# Patient Record
Sex: Male | Born: 1975 | Race: White | Hispanic: Yes | Marital: Single | Smoking: Never smoker
Health system: Southern US, Community
[De-identification: ages and names within clinical notes are randomized; demographics above are authoritative.]

## PROBLEM LIST (undated history)

## (undated) DIAGNOSIS — R011 Cardiac murmur, unspecified: Secondary | ICD-10-CM

## (undated) DIAGNOSIS — E119 Type 2 diabetes mellitus without complications: Secondary | ICD-10-CM

## (undated) DIAGNOSIS — J45909 Unspecified asthma, uncomplicated: Secondary | ICD-10-CM

## (undated) HISTORY — DX: Type 2 diabetes mellitus without complications: E11.9

---

## 2004-02-02 ENCOUNTER — Emergency Department: Payer: Self-pay | Admitting: Emergency Medicine

## 2006-10-24 ENCOUNTER — Emergency Department: Payer: Self-pay | Admitting: Emergency Medicine

## 2007-06-28 ENCOUNTER — Emergency Department: Payer: Self-pay | Admitting: Emergency Medicine

## 2007-10-02 ENCOUNTER — Other Ambulatory Visit: Payer: Self-pay

## 2007-10-02 ENCOUNTER — Emergency Department: Payer: Self-pay | Admitting: Emergency Medicine

## 2009-02-22 IMAGING — CT CT ABD-PELV W/O CM
1 of 2 series · 15 of 32 positions shown, 19 images · non-contrast
Comparison: none

REASON FOR EXAM: (1) right hydronephrosis; (2) renal  calculi
COMMENTS:

[Series 2: stone · axial · 0.86mm/px · z∈[-630,-183]mm · 15 of 163 slices shown, 19 images]
[im 7/163  soft-tissue]
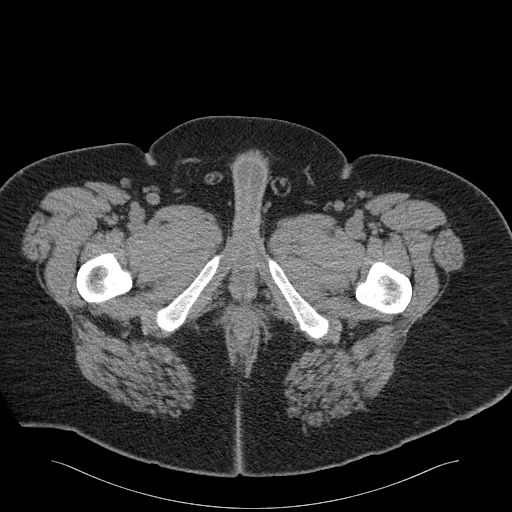
[im 7/163  bone]
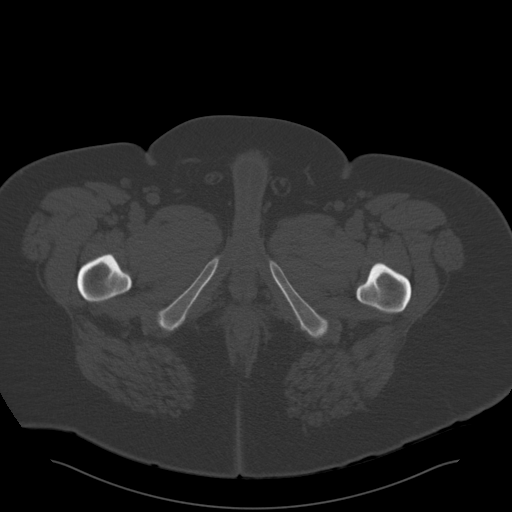
[im 21/163  soft-tissue]
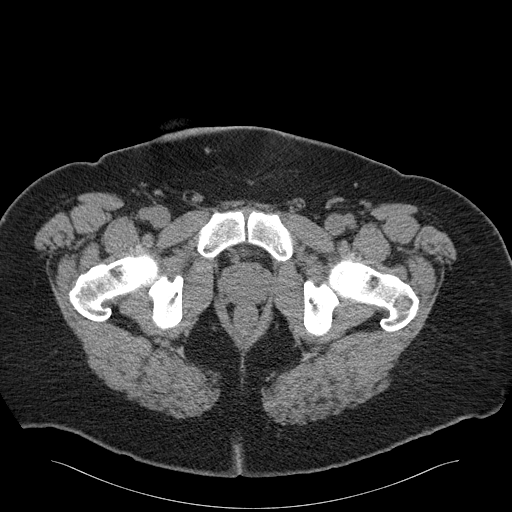
[im 34/163  soft-tissue]
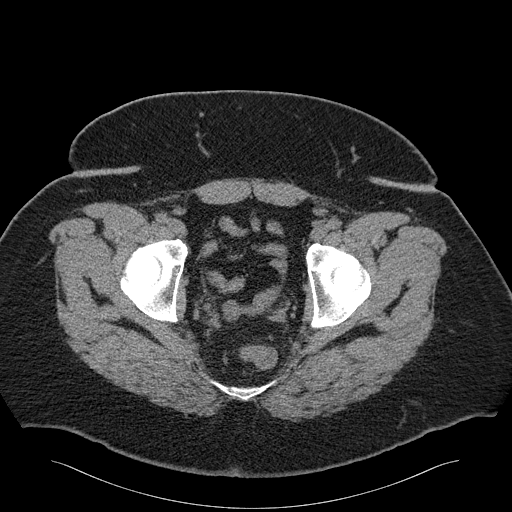
[im 48/163  soft-tissue]
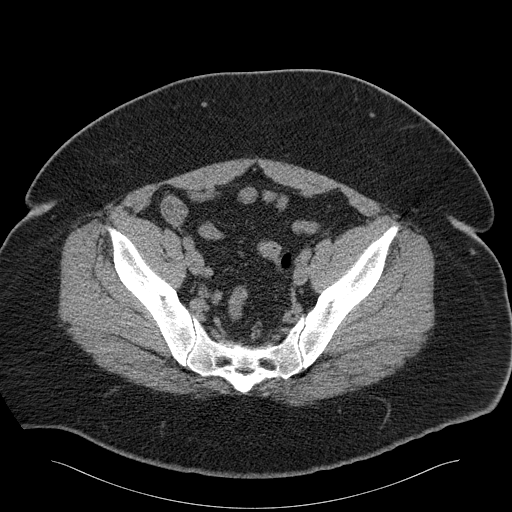
[im 55/163  soft-tissue]
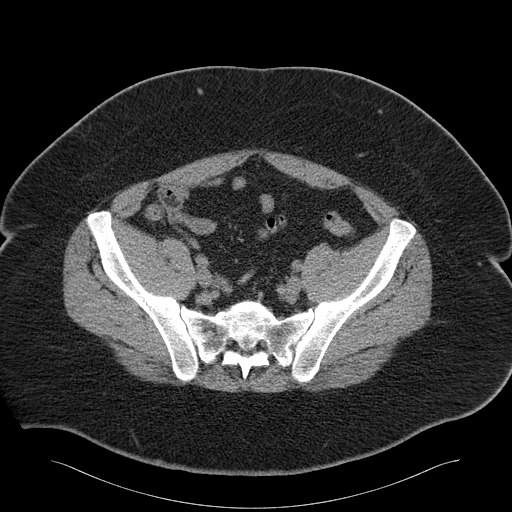
[im 68/163  soft-tissue]
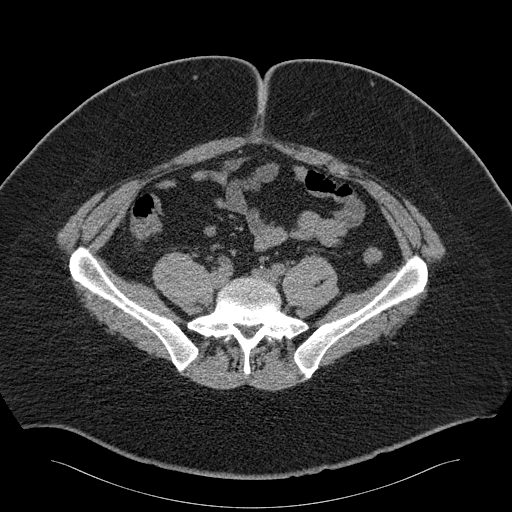
[im 82/163  soft-tissue]
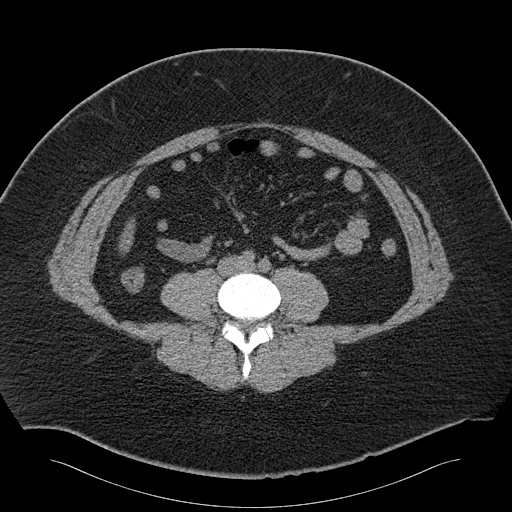
[im 95/163  soft-tissue]
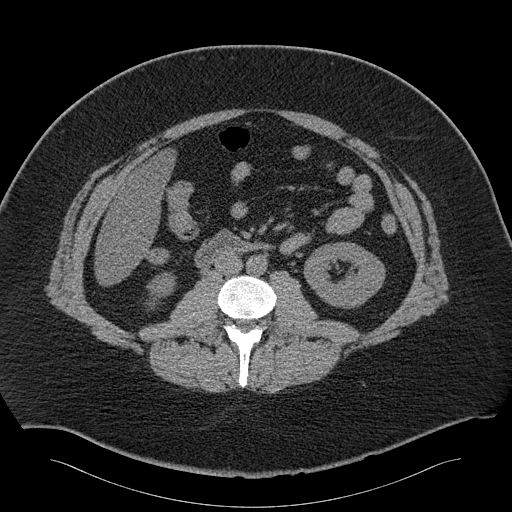
[im 109/163  soft-tissue]
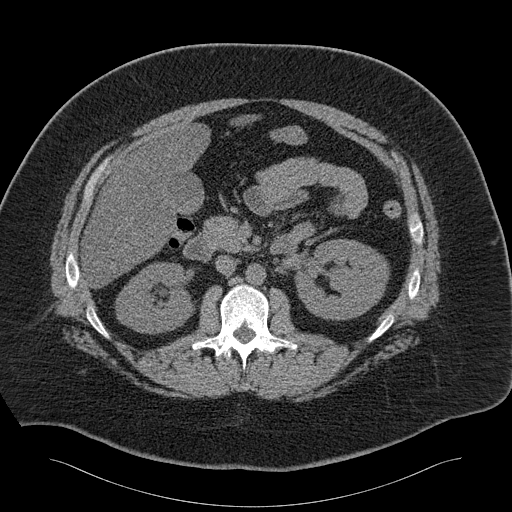
[im 109/163  bone]
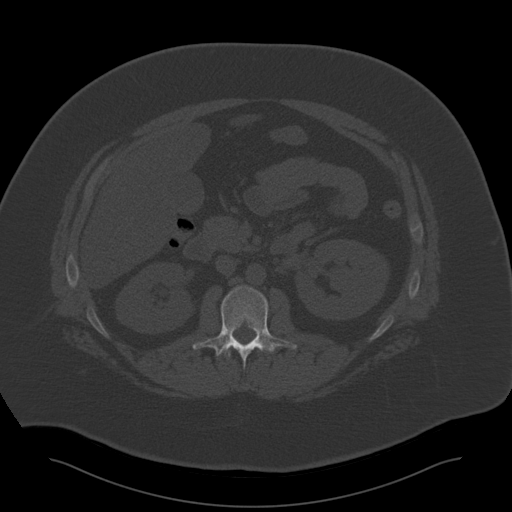
[im 115/163  soft-tissue]
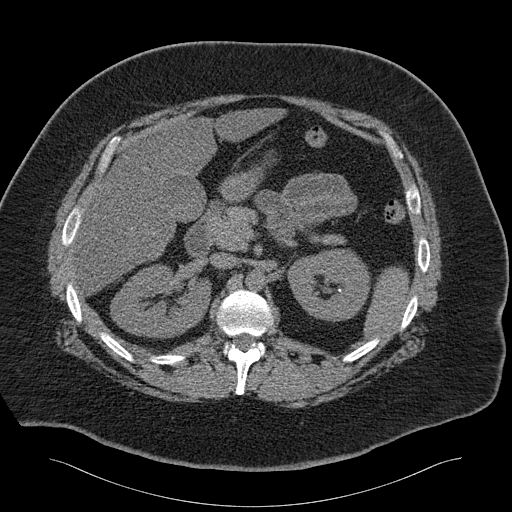
[im 129/163  soft-tissue]
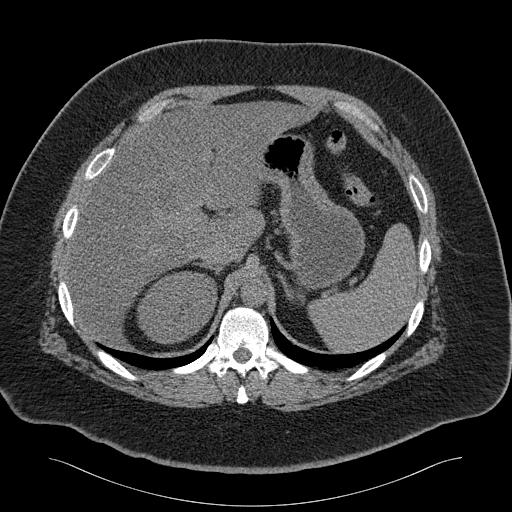
[im 136/163  lung]
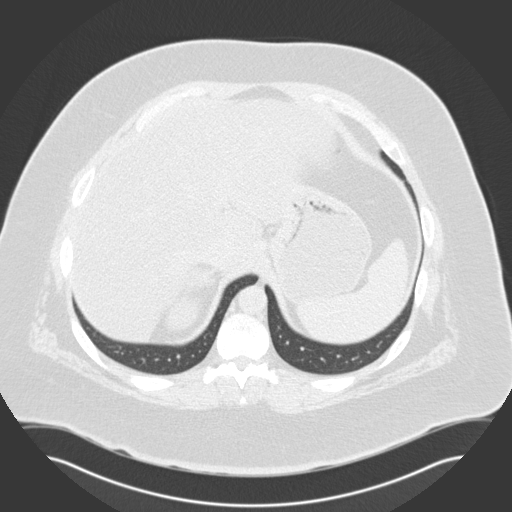
[im 142/163  soft-tissue]
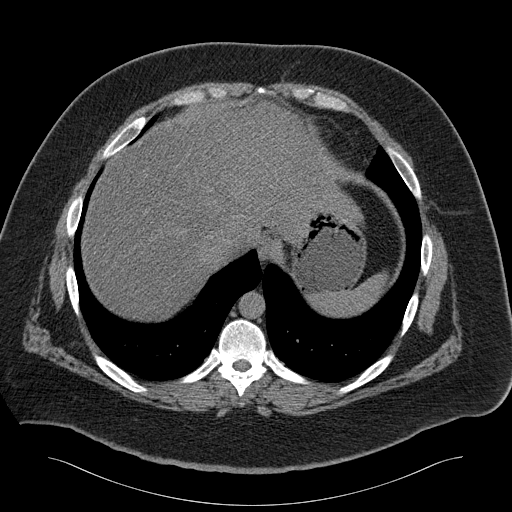
[im 142/163  lung]
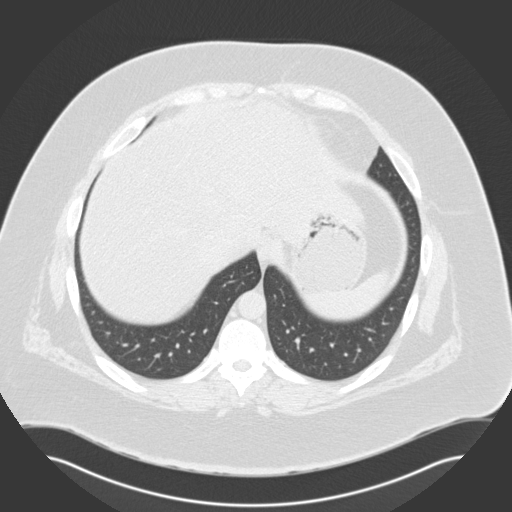
[im 149/163  lung]
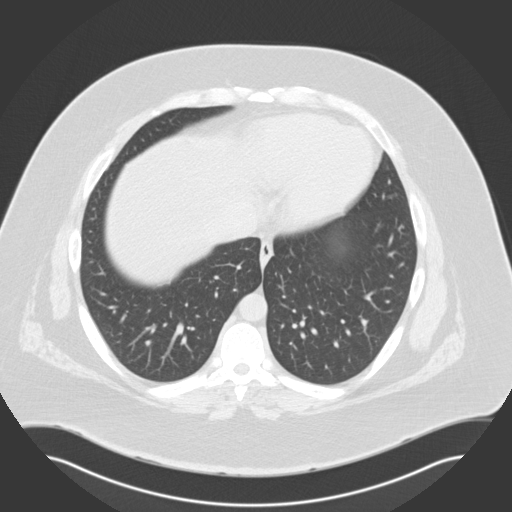
[im 156/163  soft-tissue]
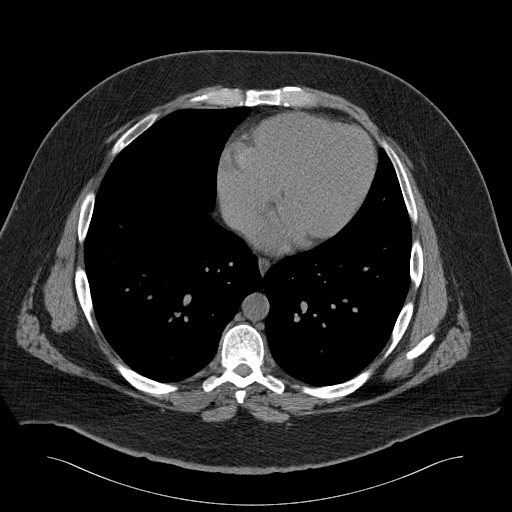
[im 156/163  lung]
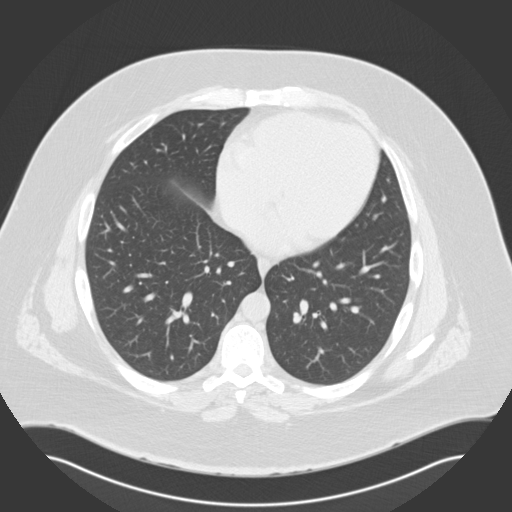

[15 of 32 positions shown; findings below may reference images not displayed]

PROCEDURE:     CT  - CT ABDOMEN AND PELVIS W[DATE]  [DATE]

RESULT:     The study was performed without IV contrast to assess the
patient for possible urinary tract stones. On the right there is very mild
hydronephrosis. The etiology for this is not clear. Very mild dilation of
the proximal right ureter is seen and on image 65 a punctate calcification
is seen but this is not clearly the Colles of the patient's ureteral
obstruction. A definite stone along the expected course of the right ureter
is not seen. Certainly the possibility of a noncalcified stone is raised.
There is a nonobstructing 3 mm diameter midpole calyceal stone on the left.
The urinary bladder is normal in appearance. The liver, stomach, spleen,
adrenal glands, pancreas, and abdominal aorta are normal in appearance. No
calcified gallstones are identified. The unopacified loops of small and
large bowel are normal in appearance as well. Appendix is demonstrated and
contains gas. There is no evidence of bowel obstruction. No free fluid is
seen in the abdomen or pelvis. The lung bases are clear.
IMPRESSION: 1. There is very mild hydronephrosis on the right without obvious etiology.
The right ureter is minimally distended. The patient may have recently
passed a stone or there could be a radiolucent stone present. There is a
nonobstructing 3 millimeter diameter calyceal stone on the left. Further
evaluation with contrast-enhanced study could be considered if the patient's
clinical history does not suggest that the stone has passed.
2. I see no acute abnormality elsewhere in the abdomen or pelvis.

The findings were called to the emergency department occlusion of the study.

## 2009-10-28 IMAGING — CR LEFT WRIST - COMPLETE 3+ VIEW
1 series · 4 of 4 positions shown · non-contrast
Comparison: none

REASON FOR EXAM: pain
COMMENTS:   LMP: (Male)

PROCEDURE:     DXR - DXR WRIST LT COMP WITH OBLIQUES  - June 29, 2007 [DATE]
RESULT:     Images of the left wrist demonstrate no fracture, dislocation or
radiopaque foreign body.

[Series 1: view not recorded · 0.17mm/px · 4 of 4 slices shown]
[im 1/4]
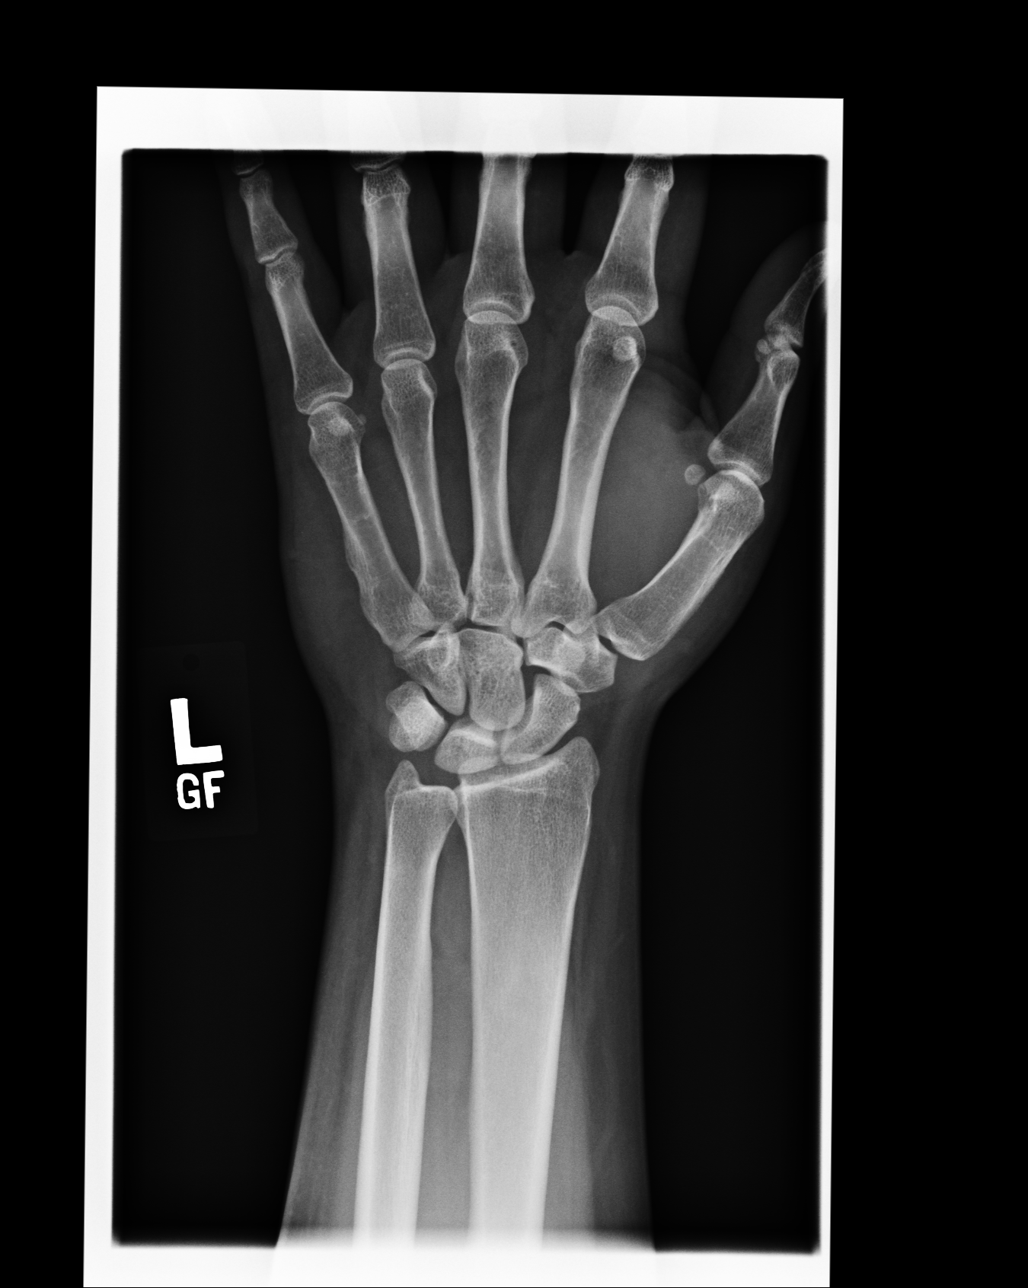
[im 2/4]
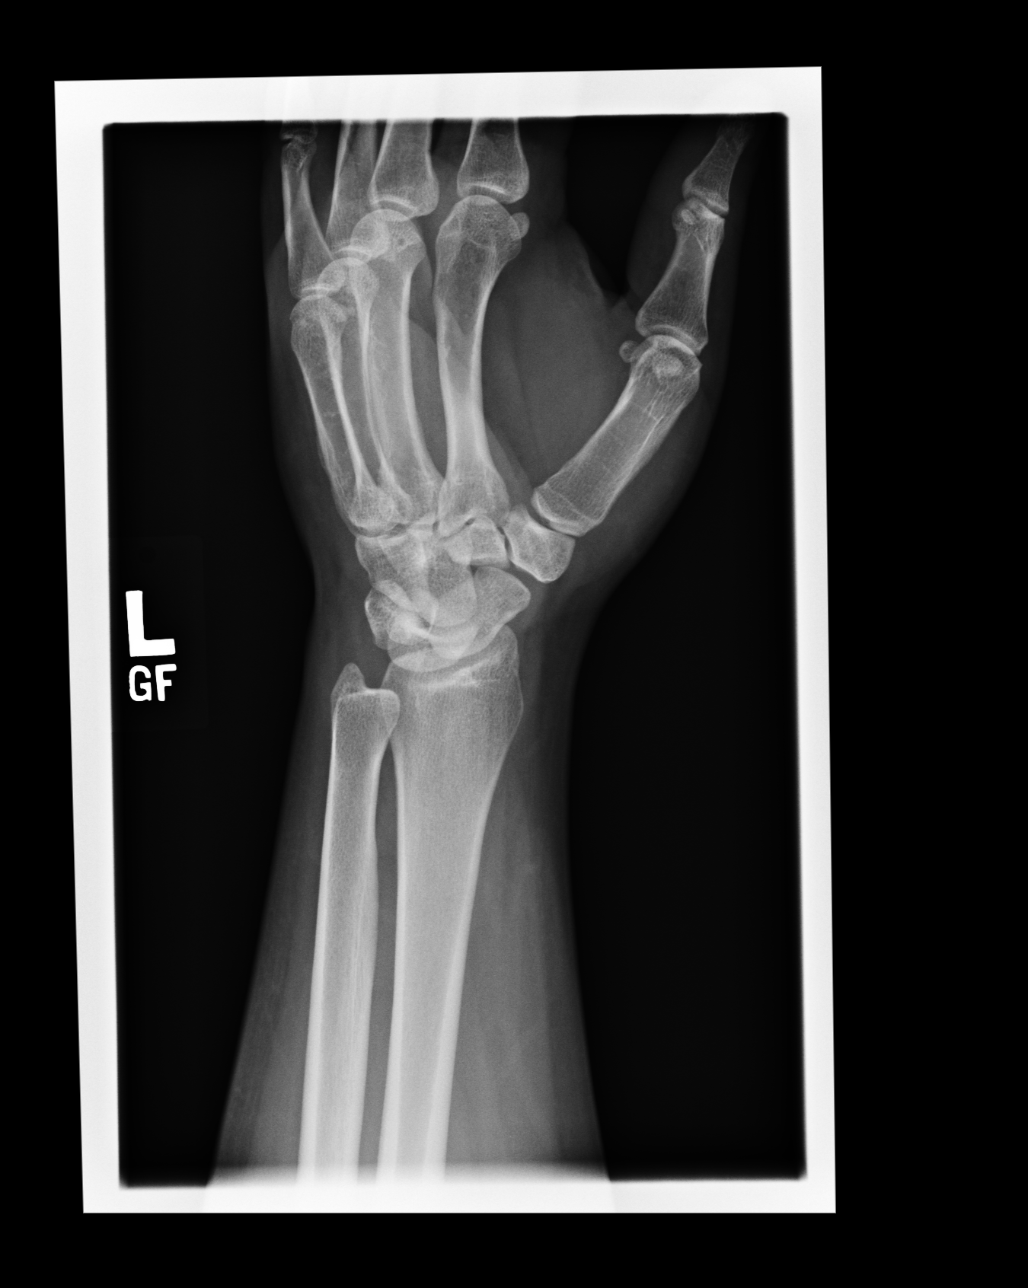
[im 3/4]
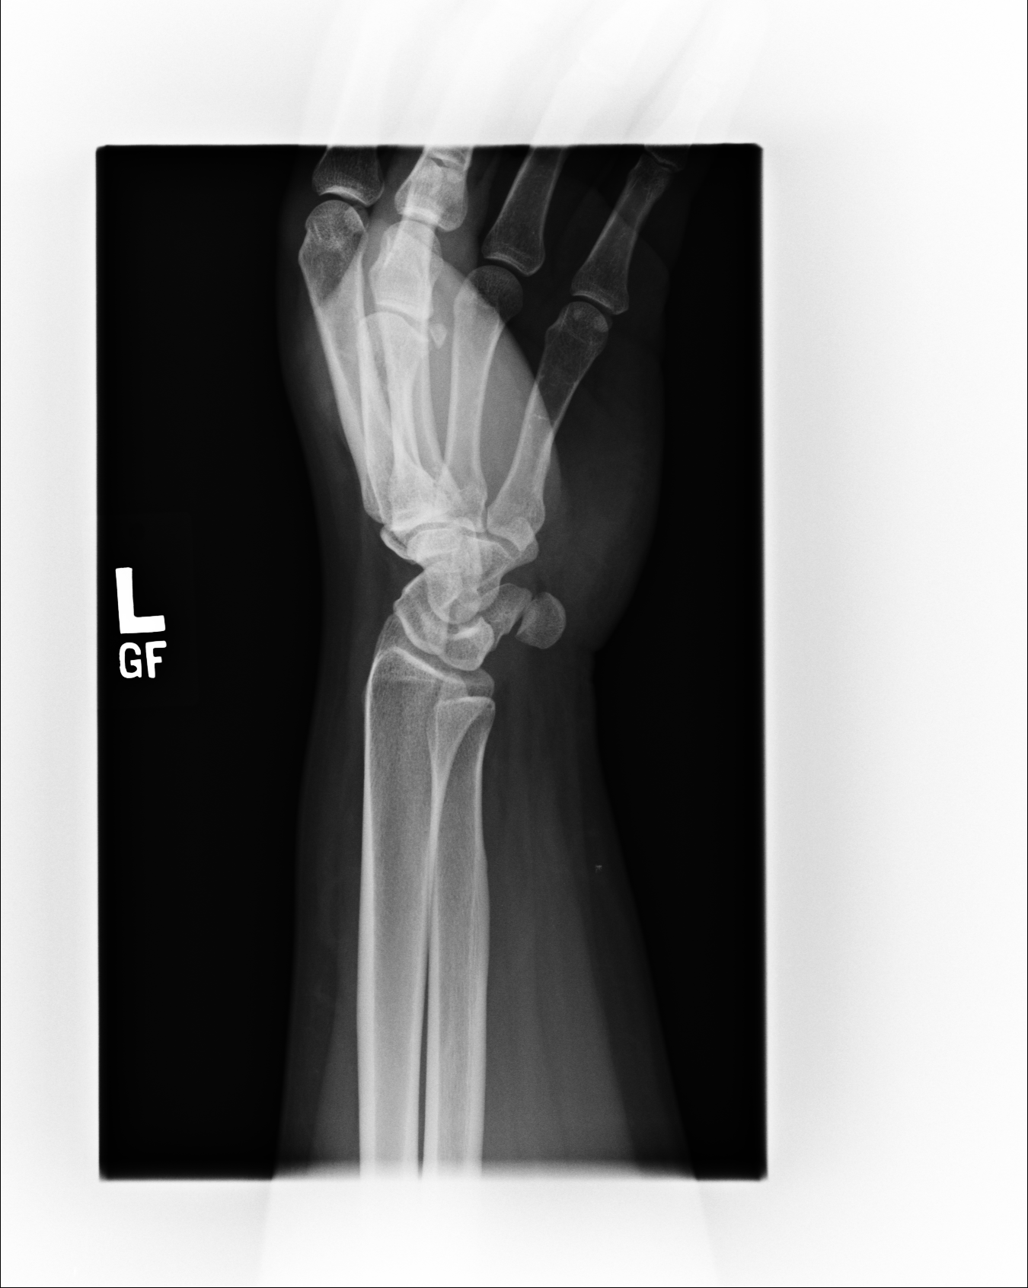
[im 4/4]
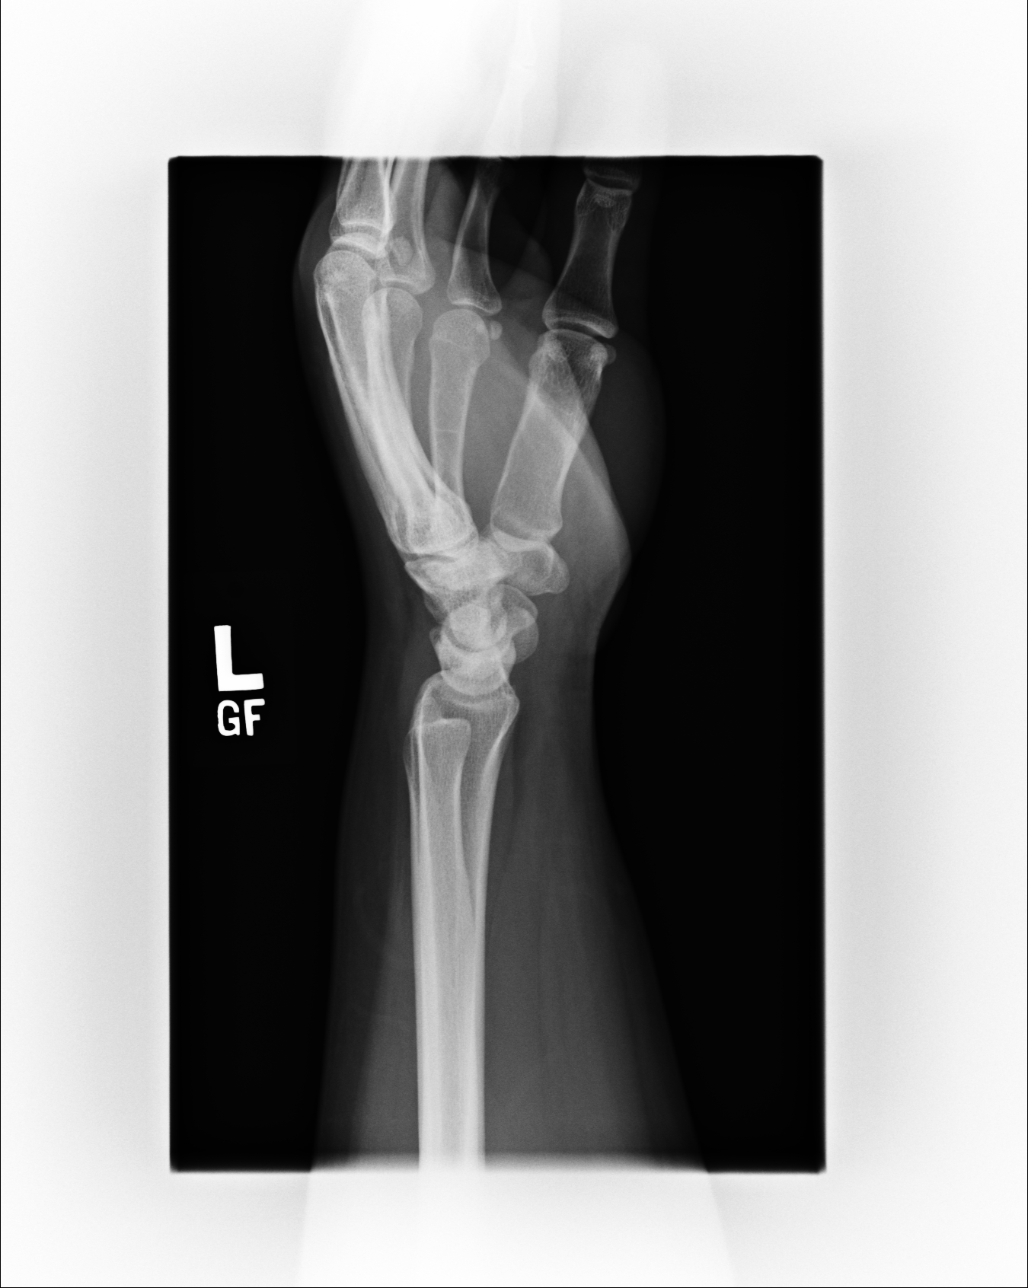

[4 of 4 positions shown; findings below may reference images not displayed]

IMPRESSION: Please see above.

## 2016-09-19 ENCOUNTER — Emergency Department
Admission: EM | Admit: 2016-09-19 | Discharge: 2016-09-19 | Disposition: A | Discharge status: Home / Self Care | Payer: PRIVATE HEALTH INSURANCE | Attending: Emergency Medicine | Admitting: Emergency Medicine

## 2016-09-19 ENCOUNTER — Emergency Department: Payer: PRIVATE HEALTH INSURANCE

## 2016-09-19 DIAGNOSIS — R319 Hematuria, unspecified: Secondary | ICD-10-CM | POA: Insufficient documentation

## 2016-09-19 DIAGNOSIS — N201 Calculus of ureter: Secondary | ICD-10-CM | POA: Insufficient documentation

## 2016-09-19 DIAGNOSIS — N23 Unspecified renal colic: Secondary | ICD-10-CM | POA: Diagnosis not present

## 2016-09-19 DIAGNOSIS — J45909 Unspecified asthma, uncomplicated: Secondary | ICD-10-CM | POA: Diagnosis not present

## 2016-09-19 DIAGNOSIS — R109 Unspecified abdominal pain: Secondary | ICD-10-CM | POA: Diagnosis present

## 2016-09-19 HISTORY — DX: Unspecified asthma, uncomplicated: J45.909

## 2016-09-19 HISTORY — DX: Cardiac murmur, unspecified: R01.1

## 2016-09-19 LAB — BASIC METABOLIC PANEL
Anion gap: 8 (ref 5–15)
BUN: 15 mg/dL (ref 6–20)
CALCIUM: 9.1 mg/dL (ref 8.9–10.3)
CO2: 25 mmol/L (ref 22–32)
CREATININE: 0.81 mg/dL (ref 0.61–1.24)
Chloride: 106 mmol/L (ref 101–111)
GFR calc Af Amer: >60 mL/min (ref >60)
GLUCOSE: 216 mg/dL — AB (ref 65–99)
Potassium: 3.8 mmol/L (ref 3.5–5.1)
Sodium: 139 mmol/L (ref 135–145)

## 2016-09-19 LAB — URINALYSIS, COMPLETE (UACMP) WITH MICROSCOPIC
BILIRUBIN URINE: NEGATIVE
Bacteria, UA: NONE SEEN
Glucose, UA: NEGATIVE mg/dL
Leukocytes, UA: NEGATIVE
NITRITE: NEGATIVE
SQUAMOUS EPITHELIAL / LPF: NONE SEEN
Specific Gravity, Urine: 1.02 (ref 1.005–1.030)
pH: 7 (ref 5.0–8.0)

## 2016-09-19 LAB — CBC
HCT: 41.6 % (ref 40.0–52.0)
Hemoglobin: 14.4 g/dL (ref 13.0–18.0)
MCH: 29.8 pg (ref 26.0–34.0)
MCHC: 34.6 g/dL (ref 32.0–36.0)
MCV: 86 fL (ref 80.0–100.0)
PLATELETS: 225 10*3/uL (ref 150–440)
RBC: 4.84 MIL/uL (ref 4.40–5.90)
RDW: 13.4 % (ref 11.5–14.5)
WBC: 12.6 10*3/uL — ABNORMAL HIGH (ref 3.8–10.6)

## 2016-09-19 MED ORDER — MORPHINE SULFATE (PF) 4 MG/ML IV SOLN
4.0000 mg | Freq: Once | INTRAVENOUS | Status: AC
  Administered 2016-09-19: 4 mg via INTRAVENOUS
  Filled 2016-09-19: qty 1

## 2016-09-19 MED ORDER — ONDANSETRON HCL 4 MG/2ML IJ SOLN
4.0000 mg | Freq: Once | INTRAMUSCULAR | Status: AC
  Administered 2016-09-19: 4 mg via INTRAVENOUS
  Filled 2016-09-19: qty 2

## 2016-09-19 MED ORDER — SODIUM CHLORIDE 0.9 % IV BOLUS (SEPSIS)
1000.0000 mL | Freq: Once | INTRAVENOUS | Status: AC
  Administered 2016-09-19: 1000 mL via INTRAVENOUS

## 2016-09-19 MED ORDER — KETOROLAC TROMETHAMINE 10 MG PO TABS: 10.0000 mg | ORAL_TABLET | Freq: Four times a day (QID) | ORAL | 0 refills | Status: DC | PRN

## 2016-09-19 MED ORDER — KETOROLAC TROMETHAMINE 30 MG/ML IJ SOLN
15.0000 mg | INTRAMUSCULAR | Status: AC
  Administered 2016-09-19: 15 mg via INTRAVENOUS
  Filled 2016-09-19: qty 1

## 2016-09-19 NOTE — ED Triage Notes (Signed)
Pt states that he believes that he has a kidney stone.  Pt states that he has a history of kidney stones.  Pt wiggling and unable to stay still in triage.  Pt states that he took hydrocodone prior to arrival.  Pt states that he had no relief.  Pt denies taking flomax.  Pt states his last kidney stone was 3-4 years ago.

## 2016-09-19 NOTE — ED Provider Notes (Signed)
[facility] Emergency Department Provider Note  ____________________________________________  Time seen: Approximately 5:01 AM  I have reviewed the triage vital signs and the nursing notes.   HISTORY  Chief Complaint Flank Pain    HPI Benjamin Fisher is a 41 y.o. male who complains of right flank pain that started about 9 PM tonight. She tried taking hydrocodone which seemed to help a little bit but inadequately. Has a history of kidney stones and this feels similar. No dysuria frequency urgency. No fevers chills. No abdominal pain chest pain shortness of breath or syncope. Pain is been constant, gradual onset, waxing and waning. No aggravating or alleviating factors. Radiates to the penis.     Past Medical History:  Diagnosis Date  . Asthma   . Heart murmur      There are no active problems to display for this patient.    History reviewed. No pertinent surgical history.   Prior to Admission medications   Medication Sig Start Date End Date Taking? Authorizing Provider  ketorolac (TORADOL) 10 MG tablet Take 1 tablet (10 mg total) by mouth every 6 (six) hours as needed for moderate pain. 09/19/16   Sharman Cheek, MD     Allergies Aspirin   No family history on file.  Social History Social History  Substance Use Topics  . Smoking status: Never Smoker  . Smokeless tobacco: Never Used  . Alcohol use Yes     Comment: occasionally    Review of Systems  Constitutional:   No fever or chills.  ENT:   No sore throat. No rhinorrhea. Cardiovascular:   No chest pain or syncope. Respiratory:   No dyspnea or cough. Gastrointestinal:  Right flank pain as above. No vomiting or diarrhea. No constipation..  Musculoskeletal:   Negative for focal pain or swelling All other systems reviewed and are negative except as documented above in ROS and HPI.  ____________________________________________   PHYSICAL EXAM:  VITAL SIGNS: ED Triage Vitals   Enc Vitals Group     BP 09/19/16 0308 (!) 197/106     Pulse Rate 09/19/16 0308 94     Resp 09/19/16 0308 (!) 24     Temp 09/19/16 0308 98 F (36.7 C)     Temp Source 09/19/16 0308 Oral     SpO2 09/19/16 0308 98 %     Weight 09/19/16 0306 (!) 380 lb (172.4 kg)     Height 09/19/16 0306 6' (1.829 m)     Head Circumference --      Peak Flow --      Pain Score 09/19/16 0308 10     Pain Loc --      Pain Edu? --      Excl. in GC? --     Vital signs reviewed, nursing assessments reviewed.   Constitutional:   Alert and oriented. Well appearing and in no distress. Eyes:   No scleral icterus.  EOMI. No nystagmus. No conjunctival pallor. PERRL. ENT   Head:   Normocephalic and atraumatic.   Nose:   No congestion/rhinnorhea.    Mouth/Throat:   MMM, no pharyngeal erythema. No peritonsillar mass.    Neck:   No meningismus. Full ROM Hematological/Lymphatic/Immunilogical:   No cervical lymphadenopathy. Cardiovascular:   RRR. Symmetric bilateral radial and DP pulses.  No murmurs.  Respiratory:   Normal respiratory effort without tachypnea/retractions. Breath sounds are clear and equal bilaterally. No wheezes/rales/rhonchi. Gastrointestinal:   Soft and nontender. Non distended. There is no CVA tenderness.  No  rebound, rigidity, or guarding. Genitourinary:   deferred Musculoskeletal:   Normal range of motion in all extremities. No joint effusions.  No lower extremity tenderness.  No edema. Neurologic:   Normal speech and language.  Motor grossly intact. No gross focal neurologic deficits are appreciated.  Skin:    Skin is warm, dry and intact. No rash noted.  No petechiae, purpura, or bullae.  ____________________________________________    LABS (pertinent positives/negatives) (all labs ordered are listed, but only abnormal results are displayed) Labs Reviewed  URINALYSIS, COMPLETE (UACMP) WITH MICROSCOPIC - Abnormal; Notable for the following:       Result Value   Color,  Urine STRAW (*)    APPearance HAZY (*)    Hgb urine dipstick MODERATE (*)    Ketones, ur TRACE (*)    Protein, ur TRACE (*)    All other components within normal limits  BASIC METABOLIC PANEL - Abnormal; Notable for the following:    Glucose, Bld 216 (*)    All other components within normal limits  CBC - Abnormal; Notable for the following:    WBC 12.6 (*)    All other components within normal limits   ____________________________________________   EKG    ____________________________________________    RADIOLOGY  Ct Renal Stone Study  Result Date: 09/19/2016 CLINICAL DATA:  Right flank pain. EXAM: CT ABDOMEN AND PELVIS WITHOUT CONTRAST TECHNIQUE: Multidetector CT imaging of the abdomen and pelvis was performed following the standard protocol without IV contrast. COMPARISON:  CT 10/24/2006 FINDINGS: Lower chest: The lung bases are clear.  No pleural fluid. Hepatobiliary: The liver is enlarged spanning 25 cm cranial caudal with severe steatosis. No evidence of focal lesion. Gallbladder is decompressed and not well evaluated. No calcified stone. No biliary dilatation. Pancreas: No ductal dilatation or inflammation. Spleen: Prominent size spanning 14.2 cm AP. Adrenals/Urinary Tract: Punctate obstructing stone at the right ureterovesicular junction with mild proximal hydroureteronephrosis and perinephric edema. There is an additional punctate nonobstructing stone in the interpolar right kidney. Multiple, at least 5, nonobstructing stones in the left kidney with no left hydronephrosis. Left ureter is decompressed. Urinary bladder is nondistended. Normal adrenal glands. Stomach/Bowel: Ingested material in the stomach. No evidence of bowel wall thickening, inflammation or obstruction. Normal appendix. Vascular/Lymphatic: No significant vascular findings are present. No enlarged abdominal or pelvic lymph nodes. Reproductive: Prostate is unremarkable. Other: No free air, free fluid, or  intra-abdominal fluid collection. Minimal fat in the right inguinal canal. Musculoskeletal: There are no acute or suspicious osseous abnormalities. Transitional lumbosacral anatomy is incidentally noted. IMPRESSION: 1. Punctate obstructing stone at the right ureterovesicular junction with mild hydronephrosis. 2. Nonobstructing bilateral renal calculi. 3. Hepatic steatosis and hepatomegaly.  Mild splenomegaly. Electronically Signed   By: Rubye OaksMelanie  Ehinger M.D.   On: 09/19/2016 03:56    ____________________________________________   PROCEDURES Procedures  ____________________________________________   INITIAL IMPRESSION / ASSESSMENT AND PLAN / ED COURSE  Pertinent labs & imaging results that were available during my care of the patient were reviewed by me and considered in my medical decision making (see chart for details).  Patient well appearing no acute distress, vital signs unremarkable. Pain resolved after receiving IV Toradol. Workup diagnostic for a small right UVJ stone which can be expected to spontaneously pass any minute. Despite receiving complete pain relief from Toradol, patient reports he's had Toradol before and it didn't work for him in the past and requests Percocet by name. Patient was advised that if he does want to take Toradol, naproxen  as another option. Follow-up with primary care. Considering the patient's symptoms, medical history, and physical examination today, I have low suspicion for cholecystitis or biliary pathology, pancreatitis, perforation or bowel obstruction, hernia, intra-abdominal abscess, AAA or dissection, volvulus or intussusception, mesenteric ischemia, or appendicitis.  No evidence of high-grade ureteral obstruction, urinary tract infection pyelonephritis or sepsis.     ____________________________________________   FINAL CLINICAL IMPRESSION(S) / ED DIAGNOSES  Final diagnoses:  Ureteral colic  Hematuria, unspecified type      New  Prescriptions   KETOROLAC (TORADOL) 10 MG TABLET    Take 1 tablet (10 mg total) by mouth every 6 (six) hours as needed for moderate pain.     Portions of this note were generated with dragon dictation software. Dictation errors may occur despite best attempts at proofreading.    Sharman Cheek, MD 09/19/16 [phone]

## 2016-09-19 NOTE — Discharge Instructions (Signed)
Results for orders placed or performed during the hospital encounter of 09/19/16  Urinalysis, Complete w Microscopic  Result Value Ref Range   Color, Urine STRAW (A) YELLOW   APPearance HAZY (A) CLEAR   Specific Gravity, Urine 1.020 1.005 - 1.030   pH 7.0 5.0 - 8.0   Glucose, UA NEGATIVE NEGATIVE mg/dL   Hgb urine dipstick MODERATE (A) NEGATIVE   Bilirubin Urine NEGATIVE NEGATIVE   Ketones, ur TRACE (A) NEGATIVE mg/dL   Protein, ur TRACE (A) NEGATIVE mg/dL   Nitrite NEGATIVE NEGATIVE   Leukocytes, UA NEGATIVE NEGATIVE   Squamous Epithelial / LPF NONE SEEN NONE SEEN   WBC, UA 0-5 0 - 5 WBC/hpf   RBC / HPF TOO NUMEROUS TO COUNT 0 - 5 RBC/hpf   Bacteria, UA NONE SEEN NONE SEEN   Mucous PRESENT   Basic metabolic panel  Result Value Ref Range   Sodium 139 135 - 145 mmol/L   Potassium 3.8 3.5 - 5.1 mmol/L   Chloride 106 101 - 111 mmol/L   CO2 25 22 - 32 mmol/L   Glucose, Bld 216 (H) 65 - 99 mg/dL   BUN 15 6 - 20 mg/dL   Creatinine, Ser 1.610.81 0.61 - 1.24 mg/dL   Calcium 9.1 8.9 - 09.610.3 mg/dL   GFR calc non Af Amer >60 >60 mL/min   GFR calc Af Amer >60 >60 mL/min   Anion gap 8 5 - 15  CBC  Result Value Ref Range   WBC 12.6 (H) 3.8 - 10.6 K/uL   RBC 4.84 4.40 - 5.90 MIL/uL   Hemoglobin 14.4 13.0 - 18.0 g/dL   HCT 04.541.6 40.940.0 - 81.152.0 %   MCV 86.0 80.0 - 100.0 fL   MCH 29.8 26.0 - 34.0 pg   MCHC 34.6 32.0 - 36.0 g/dL   RDW 91.413.4 78.211.5 - 95.614.5 %   Platelets 225 150 - 440 K/uL   Ct Renal Stone Study  Result Date: 09/19/2016 CLINICAL DATA:  Right flank pain. EXAM: CT ABDOMEN AND PELVIS WITHOUT CONTRAST TECHNIQUE: Multidetector CT imaging of the abdomen and pelvis was performed following the standard protocol without IV contrast. COMPARISON:  CT 10/24/2006 FINDINGS: Lower chest: The lung bases are clear.  No pleural fluid. Hepatobiliary: The liver is enlarged spanning 25 cm cranial caudal with severe steatosis. No evidence of focal lesion. Gallbladder is decompressed and not well evaluated. No  calcified stone. No biliary dilatation. Pancreas: No ductal dilatation or inflammation. Spleen: Prominent size spanning 14.2 cm AP. Adrenals/Urinary Tract: Punctate obstructing stone at the right ureterovesicular junction with mild proximal hydroureteronephrosis and perinephric edema. There is an additional punctate nonobstructing stone in the interpolar right kidney. Multiple, at least 5, nonobstructing stones in the left kidney with no left hydronephrosis. Left ureter is decompressed. Urinary bladder is nondistended. Normal adrenal glands. Stomach/Bowel: Ingested material in the stomach. No evidence of bowel wall thickening, inflammation or obstruction. Normal appendix. Vascular/Lymphatic: No significant vascular findings are present. No enlarged abdominal or pelvic lymph nodes. Reproductive: Prostate is unremarkable. Other: No free air, free fluid, or intra-abdominal fluid collection. Minimal fat in the right inguinal canal. Musculoskeletal: There are no acute or suspicious osseous abnormalities. Transitional lumbosacral anatomy is incidentally noted. IMPRESSION: 1. Punctate obstructing stone at the right ureterovesicular junction with mild hydronephrosis. 2. Nonobstructing bilateral renal calculi. 3. Hepatic steatosis and hepatomegaly.  Mild splenomegaly. Electronically Signed   By: Rubye OaksMelanie  Ehinger M.D.   On: 09/19/2016 03:56

## 2019-01-19 IMAGING — CT CT RENAL STONE PROTOCOL
2 of 4 series · 16 of 46 positions shown, 18 images · non-contrast
Comparison: CT 10/24/2006

CLINICAL DATA: Right flank pain.

EXAM:
CT ABDOMEN AND PELVIS WITHOUT CONTRAST
TECHNIQUE: Multidetector CT imaging of the abdomen and pelvis was performed
following the standard protocol without IV contrast.

[Series 2: stone full standard · axial · 0.98mm/px · z∈[-1048,-568]mm · 13 of 106 slices shown, 15 images]
[im 5/106  soft-tissue]
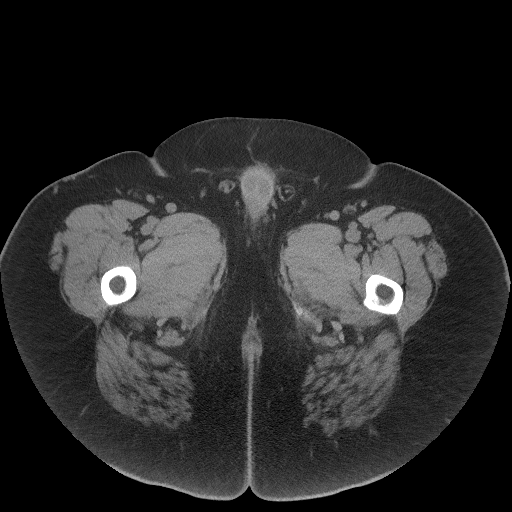
[im 5/106  bone]
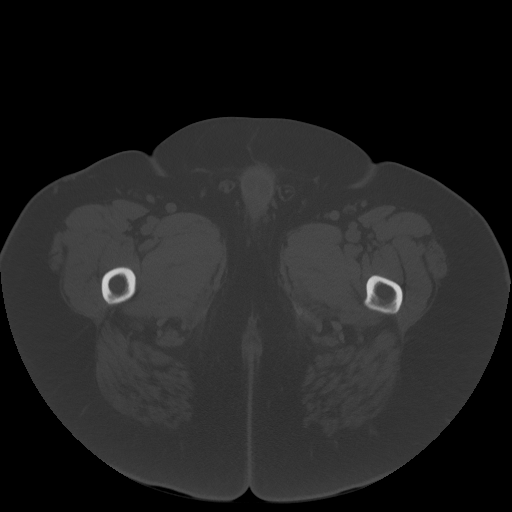
[im 14/106  soft-tissue]
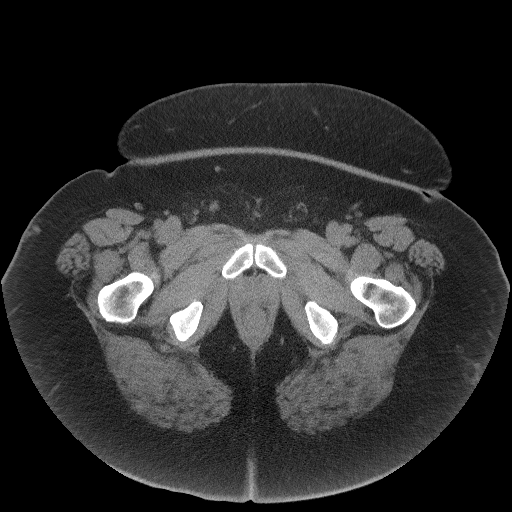
[im 22/106  soft-tissue]
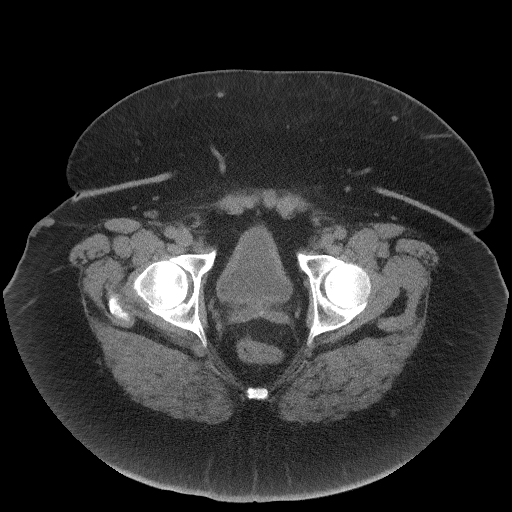
[im 31/106  soft-tissue]
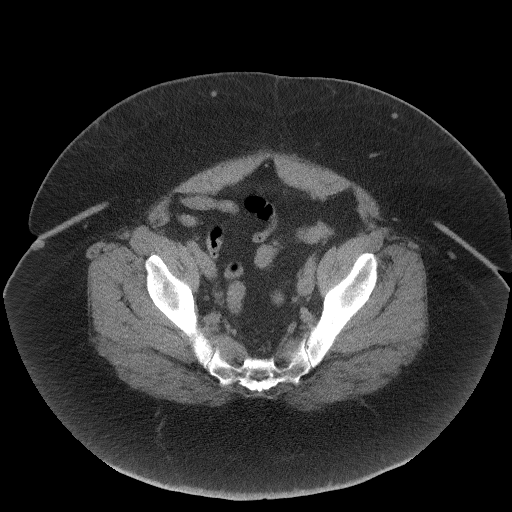
[im 36/106  soft-tissue]
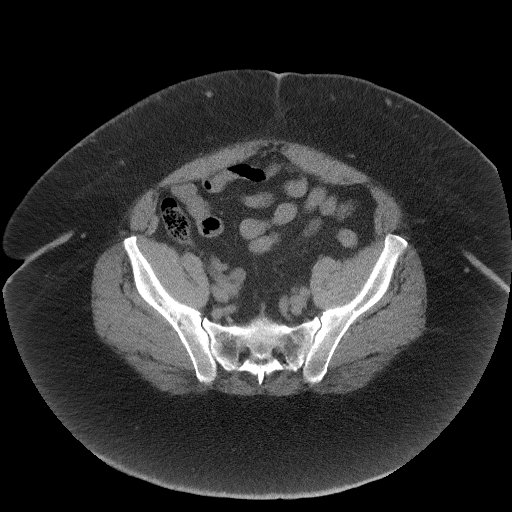
[im 44/106  soft-tissue]
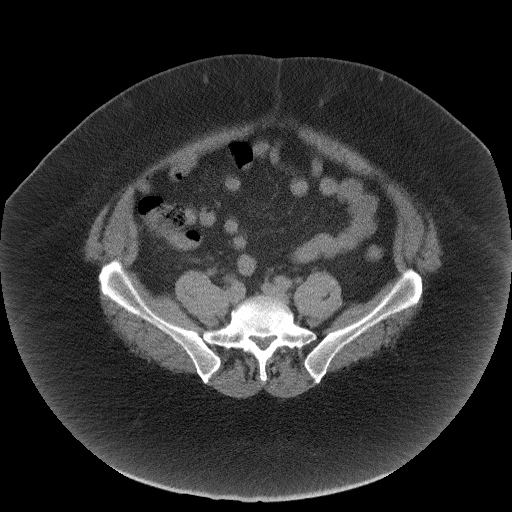
[im 53/106  soft-tissue]
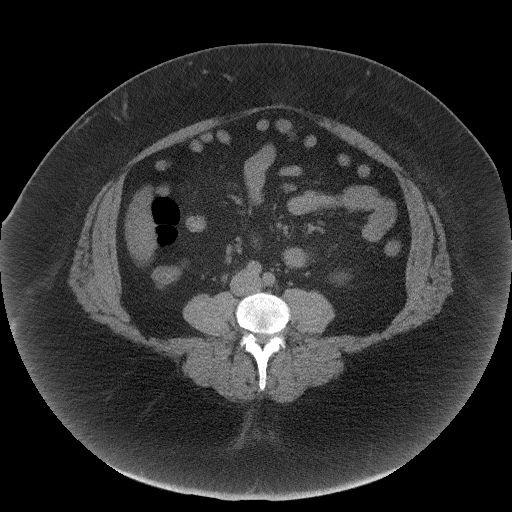
[im 62/106  soft-tissue]
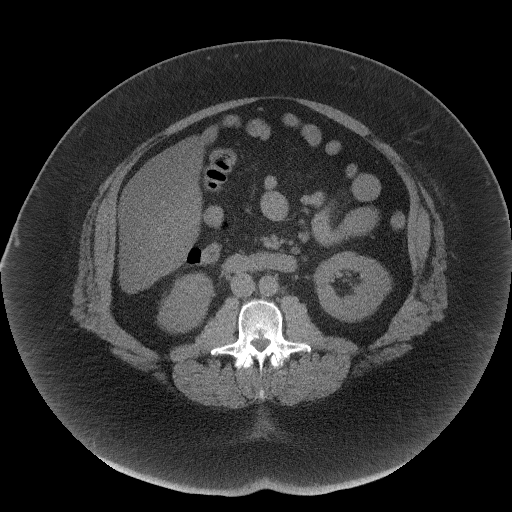
[im 71/106  soft-tissue]
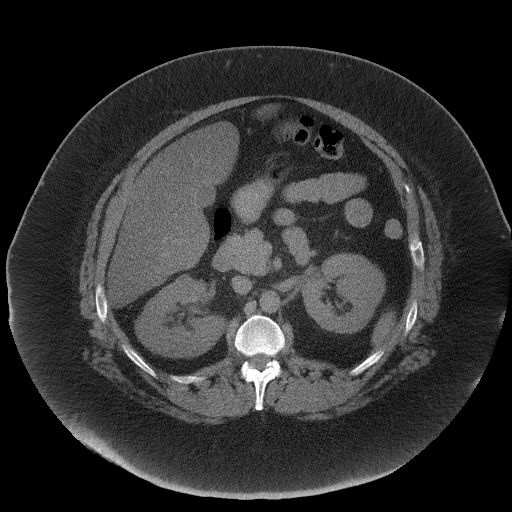
[im 71/106  bone]
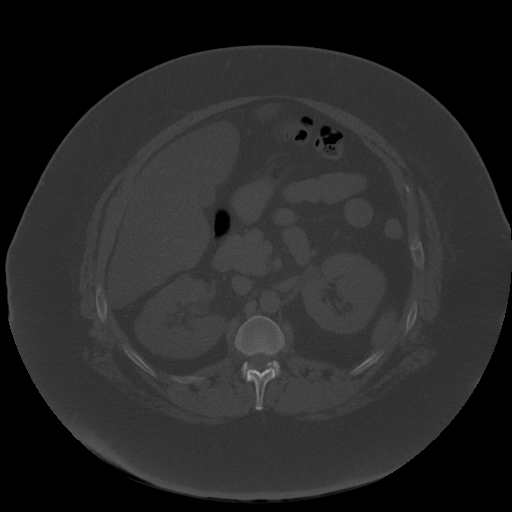
[im 75/106  soft-tissue]
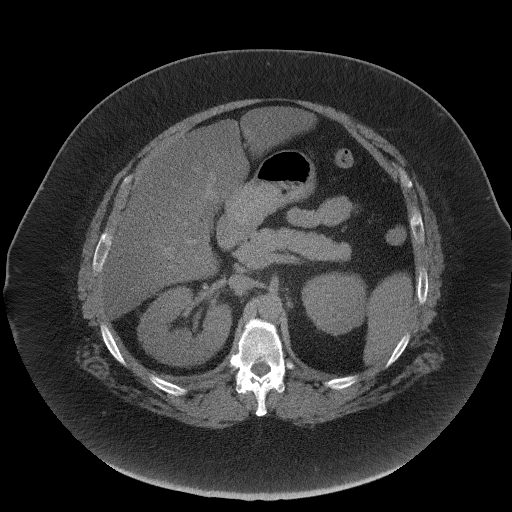
[im 84/106  soft-tissue]
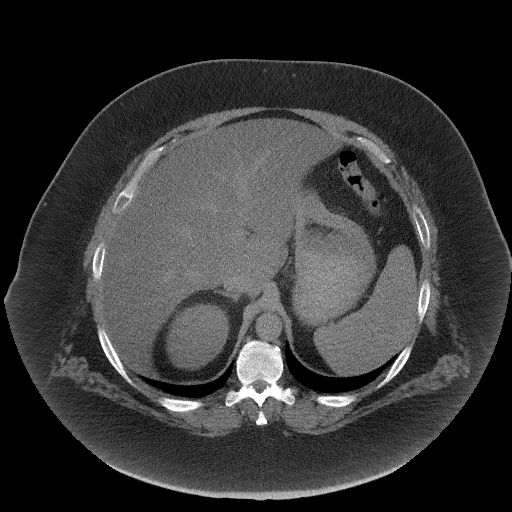
[im 92/106  soft-tissue]
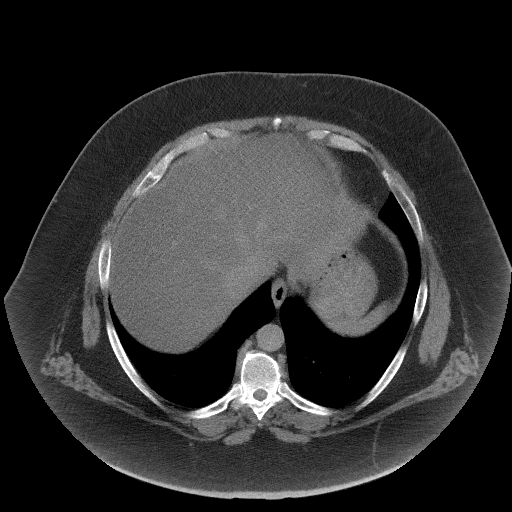
[im 101/106  soft-tissue]
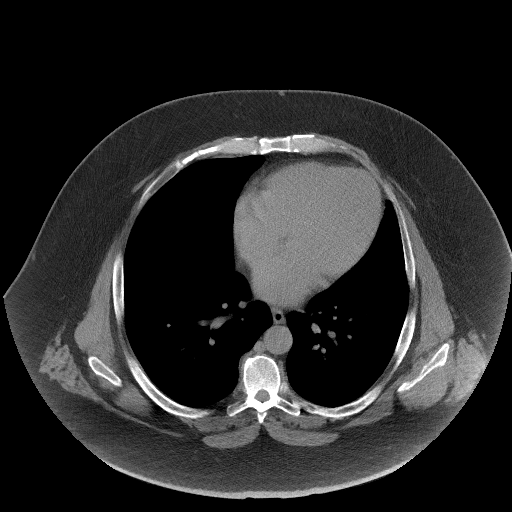

[Series 5: coronal · coronal · 0.84mm/px · 3 of 207 slices shown]
[im 69/207  soft-tissue]
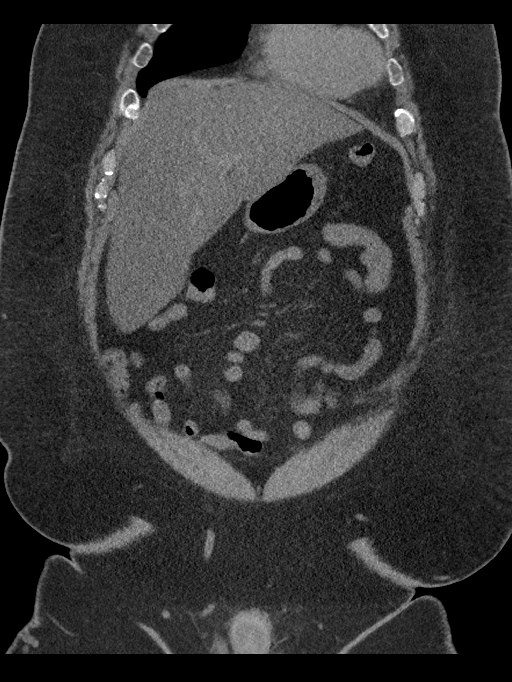
[im 92/207  soft-tissue]
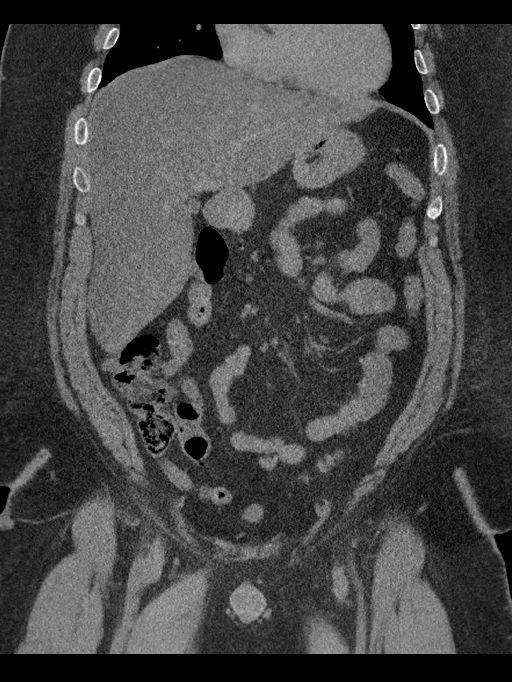
[im 115/207  soft-tissue]
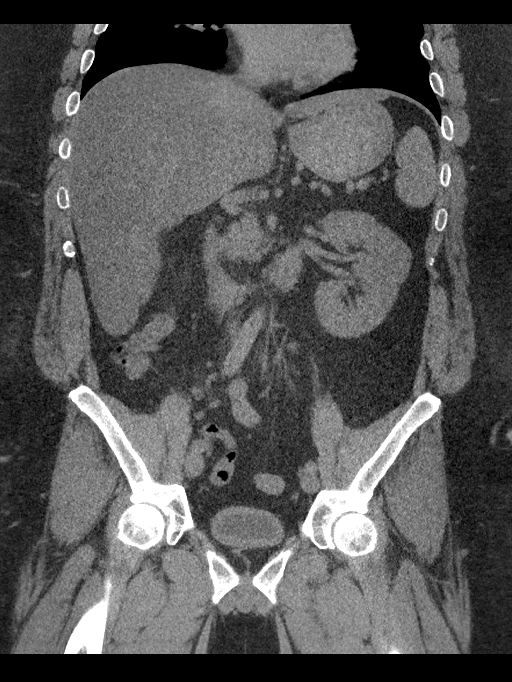

[16 of 46 positions shown; findings below may reference images not displayed]

FINDINGS: Lower chest: The lung bases are clear.  No pleural fluid.

Hepatobiliary: The liver is enlarged spanning 25 cm cranial caudal
with severe steatosis. No evidence of focal lesion. Gallbladder is
decompressed and not well evaluated. No calcified stone. No biliary
dilatation.

Pancreas: No ductal dilatation or inflammation.

Spleen: Prominent size spanning 14.2 cm AP.

Adrenals/Urinary Tract: Punctate obstructing stone at the right
ureterovesicular junction with mild proximal hydroureteronephrosis
and perinephric edema. There is an additional punctate
nonobstructing stone in the interpolar right kidney. Multiple, at
least 5, nonobstructing stones in the left kidney with no left
hydronephrosis. Left ureter is decompressed. Urinary bladder is
nondistended. Normal adrenal glands.

Stomach/Bowel: Ingested material in the stomach. No evidence of
bowel wall thickening, inflammation or obstruction. Normal appendix.

Vascular/Lymphatic: No significant vascular findings are present. No
enlarged abdominal or pelvic lymph nodes.

Reproductive: Prostate is unremarkable.

Other: No free air, free fluid, or intra-abdominal fluid collection.
Minimal fat in the right inguinal canal.

Musculoskeletal: There are no acute or suspicious osseous
abnormalities. Transitional lumbosacral anatomy is incidentally
noted.
IMPRESSION: 1. Punctate obstructing stone at the right ureterovesicular junction
with mild hydronephrosis.
2. Nonobstructing bilateral renal calculi.
3. Hepatic steatosis and hepatomegaly.  Mild splenomegaly.

## 2023-05-05 NOTE — Progress Notes (Unsigned)
Sleep Medicine   Office Visit  Patient Name: Benjamin Fisher DOB: Aug 10, 1975 MRN [id number]    Chief Complaint: suspected sleep apnea  Brief History:  Benjamin Fisher presents for an initial consult for sleep evaluation and to establish care. Patient has a longstanding history of excessive daytime sleepiness. Sleep quality is fair. This is noted most nights. The patient's bed partner reports  snoring, choking, gasping, witnessed apneic pauses at night. The patient relates the following symptoms: fatigue, trouble concentrating, brain fogginess, and headaches are also present. The patient goes to sleep at 1030 pm and wakes up at 0600 am and will wake up several times in between to use the restroom.  Sleep quality is worse when outside home environment.  Patient has noted significant movement of his legs at night that would disrupt his sleep.  The patient  relates heavy dreaming and nightmares as unusual behavior during the night.  The patient relates anxiety as a history of psychiatric problems. The Epworth Sleepiness Score is 10 out of 24 .  The patient relates  Cardiovascular risk factors include: elevated blood pressure.     ROS  General: (-) fever, (-) chills, (-) night sweat Nose and Sinuses: (-) nasal stuffiness or itchiness, (-) postnasal drip, (-) nosebleeds, (-) sinus trouble. Mouth and Throat: (-) sore throat, (-) hoarseness. Neck: (-) swollen glands, (-) enlarged thyroid, (-) neck pain. Respiratory: - cough, - shortness of breath, + wheezing. Neurologic: - numbness, - tingling. Psychiatric: - anxiety, - depression Sleep behavior: -sleep paralysis -hypnogogic hallucinations -dream enactment      -vivid dreams -cataplexy -night terrors -sleep walking   Current Medication: Outpatient Encounter Medications as of 05/06/2023  Medication Sig   Berberine Chloride (BERBERINE HCI PO) Take by mouth.   Chromium Picolinate (CHROMIUM PICOLATE PO) Take by mouth.   CINNAMON PO Take by mouth.    [DISCONTINUED] ketorolac (TORADOL) 10 MG tablet Take 1 tablet (10 mg total) by mouth every 6 (six) hours as needed for moderate pain.   No facility-administered encounter medications on file as of 05/06/2023.    Surgical History: History reviewed. No pertinent surgical history.  Medical History: Past Medical History:  Diagnosis Date   Asthma    Diabetes (HCC)    Heart murmur     Family History: Non contributory to the present illness  Social History: Social History   Socioeconomic History   Marital status: Single    Spouse name: Not on file   Number of children: Not on file   Years of education: Not on file   Highest education level: Not on file  Occupational History   Not on file  Tobacco Use   Smoking status: Never   Smokeless tobacco: Never  Vaping Use   Vaping status: Some Days  Substance and Sexual Activity   Alcohol use: Yes    Comment: occasionally   Drug use: No   Sexual activity: Not on file  Other Topics Concern   Not on file  Social History Narrative   Not on file   Social Drivers of Health   Financial Resource Strain: Not on file  Food Insecurity: Not on file  Transportation Needs: Not on file  Physical Activity: Not on file  Stress: Not on file  Social Connections: Not on file  Intimate Partner Violence: Not on file    Vital Signs: Blood pressure (!) 158/113, pulse 90, resp. rate 16, height 5\' 11"  (1.803 m), weight (!) 326 lb (147.9 kg), SpO2 96%. Body mass index is 45.47  kg/m.   Examination: General Appearance: The patient is well-developed, well-nourished, and in no distress. Neck Circumference: 46 cm Skin: Gross inspection of skin unremarkable. Head: normocephalic, no gross deformities. Eyes: no gross deformities noted. ENT: ears appear grossly normal Neurologic: Alert and oriented. No involuntary movements.    STOP BANG RISK ASSESSMENT S (snore) Have you been told that you snore?     YES   T (tired) Are you often tired,  fatigued, or sleepy during the day?   YES  O (obstruction) Do you stop breathing, choke, or gasp during sleep? YES   P (pressure) Do you have or are you being treated for high blood pressure? NO   B (BMI) Is your body index greater than 35 kg/m? YES   A (age) Are you 70 years old or older? NO   N (neck) Do you have a neck circumference greater than 16 inches?   YES   G (gender) Are you a male? YES   TOTAL STOP/BANG "YES" ANSWERS 6                                                               A STOP-Bang score of 2 or less is considered low risk, and a score of 5 or more is high risk for having either moderate or severe OSA. For people who score 3 or 4, doctors may need to perform further assessment to determine how likely they are to have OSA.         EPWORTH SLEEPINESS SCALE:  Scale:  (0)= no chance of dozing; (1)= slight chance of dozing; (2)= moderate chance of dozing; (3)= high chance of dozing  Chance  Situtation    Sitting and reading: 2    Watching TV: 2    Sitting Inactive in public: 0    As a passenger in car: 3      Lying down to rest: 1    Sitting and talking: 0    Sitting quielty after lunch: 1    In a car, stopped in traffic: 1   TOTAL SCORE:   10 out of 24    SLEEP STUDIES:  None   LABS: No results found for this or any previous visit (from the past 2160 hours).  Radiology: CT Renal Stone Study Result Date: 09/19/2016 CLINICAL DATA:  Right flank pain. EXAM: CT ABDOMEN AND PELVIS WITHOUT CONTRAST TECHNIQUE: Multidetector CT imaging of the abdomen and pelvis was performed following the standard protocol without IV contrast. COMPARISON:  CT 10/24/2006 FINDINGS: Lower chest: The lung bases are clear.  No pleural fluid. Hepatobiliary: The liver is enlarged spanning 25 cm cranial caudal with severe steatosis. No evidence of focal lesion. Gallbladder is decompressed and not well evaluated. No calcified stone. No biliary dilatation. Pancreas: No  ductal dilatation or inflammation. Spleen: Prominent size spanning 14.2 cm AP. Adrenals/Urinary Tract: Punctate obstructing stone at the right ureterovesicular junction with mild proximal hydroureteronephrosis and perinephric edema. There is an additional punctate nonobstructing stone in the interpolar right kidney. Multiple, at least 5, nonobstructing stones in the left kidney with no left hydronephrosis. Left ureter is decompressed. Urinary bladder is nondistended. Normal adrenal glands. Stomach/Bowel: Ingested material in the stomach. No evidence of bowel wall thickening, inflammation or obstruction. Normal appendix. Vascular/Lymphatic: No significant vascular  findings are present. No enlarged abdominal or pelvic lymph nodes. Reproductive: Prostate is unremarkable. Other: No free air, free fluid, or intra-abdominal fluid collection. Minimal fat in the right inguinal canal. Musculoskeletal: There are no acute or suspicious osseous abnormalities. Transitional lumbosacral anatomy is incidentally noted. IMPRESSION: 1. Punctate obstructing stone at the right ureterovesicular junction with mild hydronephrosis. 2. Nonobstructing bilateral renal calculi. 3. Hepatic steatosis and hepatomegaly.  Mild splenomegaly. Electronically Signed   By: Rubye Oaks M.D.   On: 09/19/2016 03:56    No results found.  No results found.    Assessment and Plan: Patient Active Problem List   Diagnosis Date Noted   Witnessed episode of apnea 05/06/2023   Morbid obesity (HCC) 05/06/2023   Elevated blood-pressure reading, without diagnosis of hypertension 05/06/2023   1. Witnessed episode of apnea (Primary) Patient evaluation suggests high risk of sleep disordered breathing due to witnessed apnea, snoring, gasping, brain fog, daytime sleepiness.  Patient has comorbid cardiovascular risk factors including: elevated blood pressure  which could be exacerbated by pathologic sleep-disordered breathing.  Suggest: PSG  to  assess/treat the patient's sleep disordered breathing. The patient was also counselled on weight loss to optimize sleep health.   2. Morbid obesity (HCC) Obesity Counseling: Had a lengthy discussion regarding patients BMI and weight issues. Patient was instructed on portion control as well as increased activity. Also discussed caloric restrictions with trying to maintain intake less than 2000 Kcal. Discussions were made in accordance with the 5As of weight management. Simple actions such as not eating late and if able to, taking a walk is suggested.   3. Elevated blood-pressure reading, without diagnosis of hypertension Discussed today's reading, and the need to monitor his blood pressure at home and follow up with a provider.      General Counseling: I have discussed the findings of the evaluation and examination with Jomarie Longs.  I have also discussed any further diagnostic evaluation thatmay be needed or ordered today. Schuyler verbalizes understanding of the findings of todays visit. We also reviewed his medications today and discussed drug interactions and side effects including but not limited excessive drowsiness and altered mental states. We also discussed that there is always a risk not just to him but also people around him. he has been encouraged to call the office with any questions or concerns that should arise related to todays visit.  No orders of the defined types were placed in this encounter.       I have personally obtained a history, evaluated the patient, evaluated pertinent data, formulated the assessment and plan and placed orders.   This patient was seen today by Emmaline Kluver, PA-C in collaboration with Dr. Freda Munro.   Yevonne Pax, MD [facility] Diplomate ABMS Pulmonary and Critical Care Medicine Sleep medicine

## 2023-05-06 ENCOUNTER — Ambulatory Visit (INDEPENDENT_AMBULATORY_CARE_PROVIDER_SITE_OTHER): Payer: 59 | Admitting: Internal Medicine

## 2023-05-06 VITALS — BP 158/113 | HR 90 | Resp 16 | Ht 71.0 in | Wt 326.0 lb

## 2023-05-06 DIAGNOSIS — R030 Elevated blood-pressure reading, without diagnosis of hypertension: Secondary | ICD-10-CM | POA: Diagnosis not present

## 2023-05-06 DIAGNOSIS — R0681 Apnea, not elsewhere classified: Secondary | ICD-10-CM | POA: Diagnosis not present

## 2023-07-02 ENCOUNTER — Encounter (INDEPENDENT_AMBULATORY_CARE_PROVIDER_SITE_OTHER): Payer: Self-pay | Admitting: Internal Medicine

## 2023-07-02 DIAGNOSIS — G4733 Obstructive sleep apnea (adult) (pediatric): Secondary | ICD-10-CM

## 2023-07-18 NOTE — Procedures (Signed)
 [facility]  Polysomnogram Report Part I                                                               Phone: [phone] Fax: [phone]  Patient Name: Benjamin Fisher, Benjamin Fisher. Acquisition Number: [id number]  Date of Birth: 1975-06-16 Acquisition Date: 07/02/2023  Referring Physician: Eleanore Grey, DNP     History: The patient is a 48 year old  who was referred for evaluation of . Medical History: Asthma, diabetes and heart murmur.  Medications: Berberrine Chloride, Chronium Picolate PO and Cinnamon PO.  Procedure: This routine overnight polysomnogram was performed on the [facility] 5 using the standard diagnostic protocol. This included 6 channels of EEG, 2 channels of EOG, chin EMG, bilateral anterior tibialis EMG, nasal/oral thermistor, PTAF (nasal pressure transducer), chest and abdominal wall movements, EKG, and pulse oximetry.  Description: The total recording time was 366.0 minutes. The total sleep time was 265.5 minutes. There were a total of 99.3 minutes of wakefulness after sleep onset for a reducedsleep efficiency of 72.5%. The latency to sleep onset was shortat 1.2 minutes. The R sleep onset latency was N/A.  Sleep parameters, as a percentage of the total sleep time, demonstrated 26.6% of sleep was in N1 sleep, 73.4% N2, 0.0% N3 and 0.0% R sleep. There were a total of 490 arousals for an arousal index of 110.7 arousals per hour of sleep that was elevated.  Respiratory monitoring demonstrated   snoring . There were 511 apneas and hypopneas for an Apnea Hypopnea Index of 115.5 apneas and hypopneas per hour of sleep.  The Respiratory Disturbance Index, which includes 6 respiratory effort related arousals (RERAs), was 116.8 respiratory events per hour of sleep.  The average duration of the respiratory events was 19.1 seconds with a maximum duration of 48.0 seconds. The respiratory events occurred in all positions. The respiratory events were associated with peripheral oxygen desaturations on  the average to 80%. The lowest oxygen desaturation associated with a respiratory event was 66%. Additionally, the baseline oxygen saturation during wakefulness was 94%, during NREM sleep averaged 89%, and during REM sleep averaged N/A. The total duration of oxygen < 90% was 138.3 minutes and <80% was 24.6 minutes.  Cardiac monitoring-  significant cardiac rhythm irregularities.   Periodic limb movement monitoring- did not demonstrate periodic limb movements.   Impression: This routine overnight polysomnogram demonstrated very severe obstructive sleep apnea with an overall Apnea Hypopnea Index of 115.5 apneas and hypopneas per hour of sleep with the lowest desaturation to 66%.  REM sleep was not observed.     reduced sleep efficiency with an elevated arousal index,increased awakeningsan no REM or slow wave sleep. These findings would appear to be due to the obstructive sleep apnea.  Recommendations:    An emergent CPAP titration would be recommended due to the severity of the sleep apnea. Additionally, would recommend weight loss in a patient with a BMI of 45.5 lb/in2    Cordie Deters, MD [facility] Diplomate ABMS Pulmonary Critical Care and Sleep Medicine Electronically reviewed and digitally signed  [facility] Polysomnogram Report Part II  Phone: [phone] Fax: [phone]  Patient last name [facility] Neck Size 18.11 in. Acquisition [phone]  Patient first name Benjamin Fisher. Weight 326.0 lbs. Started 07/02/2023  at 11:06:21 PM  Birth date October 27, 1975 Height 71.0 in. Stopped 07/03/2023 at 6:16:27 AM  Age 48 BMI 45.5 lb/in2 Duration 366.0  Study Type Adult      Report generated by Jackalyn Martyr, RPSGT   Reviewed by: Kathe G. Henke, PhD, ABSM, FAASM Sleep Data: Lights Out: 11:24:39 PM Sleep Onset: 11:25:51 PM  Lights On: 5:30:39 AM Sleep Efficiency: 72.5 %  Total Recording Time: 366.0 min Sleep Latency (from Lights Off) 1.2 min  Total Sleep Time (TST): 265.5 min R Latency (from Sleep  Onset): N/A  Sleep Period Time: 364.5 min Total number of awakenings: 26  Wake during sleep: 99.0 min Wake After Sleep Onset (WASO): 99.3 min   Sleep Data:         Arousal Summary: Stage  Latency from lights out (min) Latency from sleep onset (min) Duration (min) % Total Sleep Time  Normal values  N 1 1.2 0.0 70.5 26.6 (5%)  N 2 1.7 0.5 195.0 73.4 (50%)  N 3       0.0 0.0 (20%)  R N/A N/A 0.0 0.0 (25%)   Number Index  Spontaneous 43 9.7  Apneas & Hypopneas 444 100.3  RERAs 6 1.4       (Apneas & Hypopneas & RERAs)  (450) (101.7)  Limb Movement 0 0.0  Snore 0 0.0  TOTAL 493 111.4     Respiratory Data:  CA OA MA Apnea Hypopnea* A+ H RERA Total  Number 0 97 0 97 [phone]  Mean Dur (sec) 0.0 20.3 0.0 20.3 18.9 19.2 15.0 19.1  Max Dur (sec) 0.0 40.0 0.0 40.0 48.0 48.0 24.5 48.0  Total Dur (min) 0.0 32.8 0.0 32.8 130.5 163.2 1.5 164.7  % of TST 0.0 12.3 0.0 12.3 49.1 61.5 0.6 62.0  Index (#/h TST) 0.0 21.9 0.0 21.9 93.6 115.5 1.4 116.8  *Hypopneas scored based on 4% or greater desaturation.  Sleep Stage:        REM NREM TST  AHI N/A 114.8 115.5  RDI N/A 116.2 116.8          Body Position Data:  Sleep (min) TST (%) REM (min) NREM (min) CA (#) OA (#) MA (#) HYP (#) AHI (#/h) RERA (#) RDI (#/h) Desat (#)  Supine 152.5 57.44 0.0 152.5 0 17 0 285 118.8 2 119.6 315  Non-Supine 113.00 42.56 0.00 113.00 0.00 80.00 0.00 129.00 110.97 4.00 113.10 231.00  Left: 113.0 42.56 0.0 113.0 0 80 0 129 111.0 4 113.1 231     Snoring: Total number of snoring episodes  0  Total time with snoring    min (   % of sleep)   Oximetry Distribution:             WK REM NREM TOTAL  Average (%)   94    89 90  < 90% 5.2 0.0 133.1 138.3  < 80% 0.9 0.0 23.7 24.6  < 70% 0.1 0.0 0.2 0.3  # of Desaturations* 28 0 511 539  Desat Index (#/hour) 36.6    115.5 121.8  Desat Max (%) 26 0 31 31  Desat Max Dur (sec) 50.0 0.0 48.0 50.0  Approx Min O2 during sleep 66  Approx min O2 during  a respiratory event 66  Was Oxygen added (Y/N) and final rate :    LPM  *Desaturations based on 3% or greater drop from baseline.   Cheyne Stokes Breathing: None Present    Heart Rate Summary:  Average Heart  Rate During Sleep 83.0 bpm      Highest Heart Rate During Sleep (95th %) 107.0 bpm      Highest Heart Rate During Sleep 220 bpm (artifact)  Highest Heart Rate During Recording (TIB) 220 bpm (artifact)   Heart Rate Observations: Event Type # Events   Bradycardia 0 Lowest HR Scored: N/A  Sinus Tachycardia During Sleep 0 Highest HR Scored: N/A  Narrow Complex Tachycardia 0 Highest HR Scored: N/A  Wide Complex Tachycardia 0 Highest HR Scored: N/A  Asystole 0 Longest Pause: N/A  Atrial Fibrillation 0 Duration Longest Event: N/A  Other Arrythmias   Type:    Periodic Limb Movement Data: (Primary legs unless otherwise noted) Total # Limb Movement 1 Limb Movement Index 0.2  Total # PLMS    PLMS Index     Total # PLMS Arousals    PLMS Arousal Index     Percentage Sleep Time with PLMS   min (   % sleep)  Mean Duration limb movements (secs)

## 2023-11-11 ENCOUNTER — Ambulatory Visit (INDEPENDENT_AMBULATORY_CARE_PROVIDER_SITE_OTHER): Admitting: Internal Medicine

## 2023-11-11 VITALS — BP 166/98 | HR 89 | Resp 16 | Ht 70.0 in | Wt 328.0 lb

## 2023-11-11 DIAGNOSIS — G4733 Obstructive sleep apnea (adult) (pediatric): Secondary | ICD-10-CM | POA: Diagnosis not present

## 2023-11-11 DIAGNOSIS — Z7189 Other specified counseling: Secondary | ICD-10-CM | POA: Insufficient documentation

## 2023-11-11 NOTE — Progress Notes (Signed)
 [facility] [address] [place], [place] [address]  Pulmonary Sleep Medicine   Office Visit Note  Patient Name: Benjamin Fisher DOB: [birth year] MRN [id number]    Chief Complaint: Obstructive Sleep Apnea visit  Brief History:  Benjamin Fisher is seen today for a follow up visit for APAP@ 4-20 cmH2O. The patient has a  history of sleep apnea. Patient is not using PAP nightly.  The patient feels not rested after sleeping with PAP.  The patient reports not benefiting from PAP use. Reported sleepiness is not improved and the Epworth Sleepiness Score is 11 out of 24. The patient does not take naps. The patient complains of the following: pt is unable to tolerate PAP pressures. The compliance download shows 0% compliance with an average use time of 55 minutes. The AHI is 16.  The patient does complain of limb movements disrupting sleep. The patient continues to require PAP therapy in order to eliminate sleep apnea.   ROS  General: (-) fever, (-) chills, (-) night sweat Nose and Sinuses: (-) nasal stuffiness or itchiness, (-) postnasal drip, (-) nosebleeds, (-) sinus trouble. Mouth and Throat: (-) sore throat, (-) hoarseness. Neck: (-) swollen glands, (-) enlarged thyroid, (-) neck pain. Respiratory: - cough, - shortness of breath, - wheezing. Neurologic: - numbness, - tingling. Psychiatric: - anxiety, - depression   Current Medication: Outpatient Encounter Medications as of 11/11/2023  Medication Sig   Berberine Chloride (BERBERINE HCI PO) Take by mouth.   Chromium Picolinate (CHROMIUM PICOLATE PO) Take by mouth.   CINNAMON PO Take by mouth.   No facility-administered encounter medications on file as of 11/11/2023.    Surgical History: No past surgical history on file.  Medical History: Past Medical History:  Diagnosis Date   Asthma    Diabetes (HCC)    Heart murmur     Family History: Non contributory to the present illness  Social History: Social History    Socioeconomic History   Marital status: Single    Spouse name: Not on file   Number of children: Not on file   Years of education: Not on file   Highest education level: Not on file  Occupational History   Not on file  Tobacco Use   Smoking status: Never   Smokeless tobacco: Never  Vaping Use   Vaping status: Some Days  Substance and Sexual Activity   Alcohol use: Yes    Comment: occasionally   Drug use: No   Sexual activity: Not on file  Other Topics Concern   Not on file  Social History Narrative   Not on file   Social Drivers of Health   Financial Resource Strain: Not on file  Food Insecurity: Not on file  Transportation Needs: Not on file  Physical Activity: Not on file  Stress: Not on file  Social Connections: Not on file  Intimate Partner Violence: Not on file    Vital Signs: Blood pressure (!) 166/98, pulse 89, resp. rate 16, height 5' 10 (1.778 m), weight (!) 328 lb (148.8 kg), SpO2 98%. Body mass index is 47.06 kg/m.    Examination: General Appearance: The patient is well-developed, well-nourished, and in no distress. Neck Circumference: 46 cm Skin: Gross inspection of skin unremarkable. Head: normocephalic, no gross deformities. Eyes: no gross deformities noted. ENT: ears appear grossly normal Neurologic: Alert and oriented. No involuntary movements.  STOP BANG RISK ASSESSMENT S (snore) Have you been told that you snore?     YES   T (tired)  Are you often tired, fatigued, or sleepy during the day?   YES  O (obstruction) Do you stop breathing, choke, or gasp during sleep? YES   P (pressure) Do you have or are you being treated for high blood pressure? NO   B (BMI) Is your body index greater than 35 kg/m? YES   A (age) Are you 57 years old or older? NO   N (neck) Do you have a neck circumference greater than 16 inches?   YES   G (gender) Are you a male? YES   TOTAL STOP/BANG "YES" ANSWERS 6       A STOP-Bang score of 2 or less is  considered low risk, and a score of 5 or more is high risk for having either moderate or severe OSA. For people who score 3 or 4, doctors may need to perform further assessment to determine how likely they are to have OSA.         EPWORTH SLEEPINESS SCALE:  Scale:  (0)= no chance of dozing; (1)= slight chance of dozing; (2)= moderate chance of dozing; (3)= high chance of dozing  Chance  Situtation    Sitting and reading: 2    Watching TV: 1    Sitting Inactive in public: 0    As a passenger in car: 3      Lying down to rest: 3    Sitting and talking: 0    Sitting quielty after lunch: 0    In a car, stopped in traffic: 2   TOTAL SCORE:   11 out of 24    SLEEP STUDIES:  PSG (07/2023) AHI 115.5/hr, min SpO2 66%   CPAP COMPLIANCE DATA:  Date Range: 10/02/2023-10/19/2023    Average Daily Use: 55 minutes  Median Use: 27 minutes  Compliance for > 4 Hours: 0%  AHI: 16 respiratory events per hour  Days Used: 8/18 days  Mask Leak: 2.6  95th Percentile Pressure: 9.2         LABS: No results found for this or any previous visit (from the past 2160 hours).  Radiology: CT Renal Stone Study Result Date: 09/19/2016 CLINICAL DATA:  Right flank pain. EXAM: CT ABDOMEN AND PELVIS WITHOUT CONTRAST TECHNIQUE: Multidetector CT imaging of the abdomen and pelvis was performed following the standard protocol without IV contrast. COMPARISON:  CT 10/24/2006 FINDINGS: Lower chest: The lung bases are clear.  No pleural fluid. Hepatobiliary: The liver is enlarged spanning 25 cm cranial caudal with severe steatosis. No evidence of focal lesion. Gallbladder is decompressed and not well evaluated. No calcified stone. No biliary dilatation. Pancreas: No ductal dilatation or inflammation. Spleen: Prominent size spanning 14.2 cm AP. Adrenals/Urinary Tract: Punctate obstructing stone at the right ureterovesicular junction with mild proximal hydroureteronephrosis and perinephric edema.  There is an additional punctate nonobstructing stone in the interpolar right kidney. Multiple, at least 5, nonobstructing stones in the left kidney with no left hydronephrosis. Left ureter is decompressed. Urinary bladder is nondistended. Normal adrenal glands. Stomach/Bowel: Ingested material in the stomach. No evidence of bowel wall thickening, inflammation or obstruction. Normal appendix. Vascular/Lymphatic: No significant vascular findings are present. No enlarged abdominal or pelvic lymph nodes. Reproductive: Prostate is unremarkable. Other: No free air, free fluid, or intra-abdominal fluid collection. Minimal fat in the right inguinal canal. Musculoskeletal: There are no acute or suspicious osseous abnormalities. Transitional lumbosacral anatomy is incidentally noted. IMPRESSION: 1. Punctate obstructing stone at the right ureterovesicular junction with mild hydronephrosis. 2. Nonobstructing bilateral renal calculi. 3. Hepatic  steatosis and hepatomegaly.  Mild splenomegaly. Electronically Signed   By: Andrea Bernhardt M.D.   On: 09/19/2016 03:56    No results found.  No results found.    Assessment and Plan: Patient Active Problem List   Diagnosis Date Noted   OSA (obstructive sleep apnea) 11/11/2023   CPAP use counseling 11/11/2023   Witnessed episode of apnea 05/06/2023   Morbid obesity (HCC) 05/06/2023   Elevated blood-pressure reading, without diagnosis of hypertension 05/06/2023   1. OSA (obstructive sleep apnea) (Primary) The patient does not  tolerate PAP and reports that he is fighting the machine. His apnea is not controlled. His apnea is so severe that we attempted to do an emergent titration on him, but unfortunately his insurance denied it. Now that he has tried APAP 4-20 and failed, and his apnea is not controlled, he needs cpap/bipap titration . F/u after setup.   2. CPAP use counseling CPAP Counseling: had a lengthy discussion with the patient regarding the importance of PAP  therapy in management of the sleep apnea. Patient appears to understand the risk factor reduction and also understands the risks associated with untreated sleep apnea. Patient will try to make a good faith effort to remain compliant with therapy. Also instructed the patient on proper cleaning of the device including the water must be changed daily if possible and use of distilled water is preferred. Patient understands that the machine should be regularly cleaned with appropriate recommended cleaning solutions that do not damage the PAP machine for example given white vinegar and water rinses. Other methods such as ozone treatment may not be as good as these simple methods to achieve cleaning.   3. Morbid obesity (HCC) Obesity Counseling: Had a lengthy discussion regarding patients BMI and weight issues. Patient was instructed on portion control as well as increased activity. Also discussed caloric restrictions with trying to maintain intake less than 2000 Kcal. Discussions were made in accordance with the 5As of weight management. Simple actions such as not eating late and if able to, taking a walk is suggested.       General Counseling: I have discussed the findings of the evaluation and examination with Fairy.  I have also discussed any further diagnostic evaluation thatmay be needed or ordered today. Casen verbalizes understanding of the findings of todays visit. We also reviewed his medications today and discussed drug interactions and side effects including but not limited excessive drowsiness and altered mental states. We also discussed that there is always a risk not just to him but also people around him. he has been encouraged to call the office with any questions or concerns that should arise related to todays visit.  No orders of the defined types were placed in this encounter.       I have personally obtained a history, examined the patient, evaluated laboratory and imaging results,  formulated the assessment and plan and placed orders. This patient was seen today by Lauraine Lay, PA-C in collaboration with Dr. Elfreda Bathe.   Elfreda DELENA Bathe, MD [facility] Diplomate ABMS Pulmonary Critical Care Medicine and Sleep Medicine

## 2023-11-11 NOTE — Patient Instructions (Signed)

## 2024-05-19 ENCOUNTER — Ambulatory Visit

## 2024-06-16 ENCOUNTER — Ambulatory Visit: Payer: PRIVATE HEALTH INSURANCE
